# Patient Record
Sex: Female | Born: 1965 | Race: Black or African American | Hispanic: No | Marital: Single | Smoking: Heavy tobacco smoker
Health system: Southern US, Community
[De-identification: ages and names within clinical notes are randomized; demographics above are authoritative.]

## PROBLEM LIST (undated history)

## (undated) DIAGNOSIS — M1990 Unspecified osteoarthritis, unspecified site: Secondary | ICD-10-CM

---

## 1998-07-14 ENCOUNTER — Emergency Department (HOSPITAL_COMMUNITY): Admission: EM | Admit: 1998-07-14 | Discharge: 1998-07-14 | Payer: Self-pay | Admitting: *Deleted

## 1998-08-12 ENCOUNTER — Other Ambulatory Visit: Admission: RE | Admit: 1998-08-12 | Discharge: 1998-08-12 | Payer: Self-pay | Admitting: Family Medicine

## 2000-02-19 ENCOUNTER — Other Ambulatory Visit: Admission: RE | Admit: 2000-02-19 | Discharge: 2000-02-19 | Payer: Self-pay | Admitting: Internal Medicine

## 2002-01-26 ENCOUNTER — Other Ambulatory Visit: Admission: RE | Admit: 2002-01-26 | Discharge: 2002-01-26 | Payer: Self-pay | Admitting: Internal Medicine

## 2003-02-15 ENCOUNTER — Other Ambulatory Visit: Admission: RE | Admit: 2003-02-15 | Discharge: 2003-02-15 | Payer: Self-pay | Admitting: *Deleted

## 2004-07-28 ENCOUNTER — Other Ambulatory Visit: Admission: RE | Admit: 2004-07-28 | Discharge: 2004-07-28 | Payer: Self-pay | Admitting: Obstetrics and Gynecology

## 2004-08-03 ENCOUNTER — Ambulatory Visit (HOSPITAL_COMMUNITY): Admission: RE | Admit: 2004-08-03 | Discharge: 2004-08-03 | Payer: Self-pay | Admitting: Obstetrics and Gynecology

## 2005-10-07 ENCOUNTER — Encounter: Admission: RE | Admit: 2005-10-07 | Discharge: 2005-10-07 | Payer: Self-pay | Admitting: Obstetrics and Gynecology

## 2006-06-16 ENCOUNTER — Encounter: Admission: RE | Admit: 2006-06-16 | Discharge: 2006-06-16 | Payer: Self-pay | Admitting: Obstetrics and Gynecology

## 2006-08-15 ENCOUNTER — Ambulatory Visit (HOSPITAL_COMMUNITY): Admission: RE | Admit: 2006-08-15 | Discharge: 2006-08-15 | Payer: Self-pay | Admitting: Obstetrics and Gynecology

## 2008-07-18 ENCOUNTER — Ambulatory Visit (HOSPITAL_COMMUNITY): Admission: RE | Admit: 2008-07-18 | Discharge: 2008-07-18 | Payer: Self-pay | Admitting: Obstetrics and Gynecology

## 2008-07-18 ENCOUNTER — Encounter (INDEPENDENT_AMBULATORY_CARE_PROVIDER_SITE_OTHER): Payer: Self-pay | Admitting: Obstetrics and Gynecology

## 2009-08-16 ENCOUNTER — Ambulatory Visit (HOSPITAL_COMMUNITY): Admission: RE | Admit: 2009-08-16 | Discharge: 2009-08-16 | Payer: Self-pay | Admitting: Family Medicine

## 2009-10-10 ENCOUNTER — Encounter
Admission: RE | Admit: 2009-10-10 | Discharge: 2009-10-17 | Payer: Self-pay | Source: Home / Self Care | Attending: Physical Medicine and Rehabilitation | Admitting: Physical Medicine and Rehabilitation

## 2009-10-17 ENCOUNTER — Ambulatory Visit: Payer: Self-pay | Admitting: Physical Medicine and Rehabilitation

## 2009-10-18 ENCOUNTER — Ambulatory Visit (HOSPITAL_COMMUNITY)
Admission: RE | Admit: 2009-10-18 | Discharge: 2009-10-18 | Payer: Self-pay | Admitting: Physical Medicine and Rehabilitation

## 2009-10-25 ENCOUNTER — Ambulatory Visit (HOSPITAL_COMMUNITY)
Admission: RE | Admit: 2009-10-25 | Discharge: 2009-10-25 | Payer: Self-pay | Admitting: Physical Medicine and Rehabilitation

## 2009-11-01 ENCOUNTER — Ambulatory Visit (HOSPITAL_COMMUNITY)
Admission: RE | Admit: 2009-11-01 | Discharge: 2009-11-01 | Payer: Self-pay | Admitting: Physical Medicine and Rehabilitation

## 2010-01-24 ENCOUNTER — Encounter: Payer: Self-pay | Admitting: Obstetrics and Gynecology

## 2010-01-25 ENCOUNTER — Encounter: Payer: Self-pay | Admitting: Obstetrics and Gynecology

## 2010-03-18 LAB — CREATININE, SERUM
Creatinine, Ser: 0.58 mg/dL (ref 0.4–1.2)
GFR calc Af Amer: >60 mL/min (ref >60)
GFR calc non Af Amer: >60 mL/min (ref >60)

## 2010-03-18 LAB — BUN: BUN: 7 mg/dL (ref 6–23)

## 2010-04-12 LAB — DIFFERENTIAL
Basophils Relative: 1 % (ref 0–1)
Eosinophils Absolute: 0 10*3/uL (ref 0.0–0.7)
Lymphs Abs: 1.8 10*3/uL (ref 0.7–4.0)
Neutro Abs: 2.5 10*3/uL (ref 1.7–7.7)
Neutrophils Relative %: 52 % (ref 43–77)

## 2010-04-12 LAB — CBC
MCV: 96.4 fL (ref 78.0–100.0)
Platelets: 232 10*3/uL (ref 150–400)
WBC: 4.7 10*3/uL (ref 4.0–10.5)

## 2010-04-12 LAB — HCG, SERUM, QUALITATIVE: Preg, Serum: NEGATIVE

## 2010-05-19 NOTE — Op Note (Signed)
Erin Dalton, Erin Dalton                ACCOUNT NO.:  [id number]   MEDICAL RECORD NO.:  [id number]          PATIENT TYPE:  AMB   LOCATION:  SDC                           FACILITY:  [facility]   PHYSICIAN:  Juluis Mire, M.D.   DATE OF BIRTH:  1965/03/06   DATE OF PROCEDURE:  07/18/2008  DATE OF DISCHARGE:  07/18/2008                               OPERATIVE REPORT   PREOPERATIVE DIAGNOSIS:  Endometrial polyp.   POSTOPERATIVE DIAGNOSIS:  Endometrial polyp.   OPERATIVE PROCEDURE:  Paracervical block.  Cervical dilatation with  hysteroscopy.  Resection of polyp.  Endometrial biopsies and curettings.   SURGEON:  Juluis Mire, MD   ANESTHESIA:  MAC along with paracervical block.   ESTIMATED BLOOD LOSS:  Minimal.   PACKS AND DRAINS:  None.   INTRAOPERATIVE BLOOD PLACED:  None.   COMPLICATIONS:  None.   INDICATIONS:  Dictated history and physical.   PROCEDURE:  The patient was taken to the OR, placed in supine position.  After satisfactory level of sedation, the patient was placed in dorsal  lithotomy position using the Allen stirrups.  The patient was draped as  a sterile field.  A speculum was placed in the vaginal vault.  The  cervix and vagina were cleansed out with Betadine.  Cervix grasped with  a single-tooth tenaculum.  A paracervical block was instituted using 1%  Nesacaine.  Uterus sounded to 9 cm.  Cervix serially dilated to a size  31 Pratt dilator.  Operative hysteroscope was introduced.  Visualization  revealed a posterior wall polyp.  This was resected and sent for  Pathology.  Endometrium was otherwise clear.  Multiple biopsies were  obtained along with curettings.  Total deficit was 65 mL.  There was  minimal bleeding.  No signs of perforation.  The speculum and single-  tooth tenaculum were removed.  The patient taken out of the dorsal  lithotomy position.  Once alert, transferred to recovery room in good  condition.  Sponge, instrument, needle count reported as correct  by the  circulating nurse.      Juluis Mire, M.D.  Electronically Signed    JSM/MEDQ  D:  07/18/2008  T:  07/19/2008  Job:  [id number]

## 2010-05-19 NOTE — H&P (Signed)
Erin Dalton, FAROOQ                ACCOUNT NO.:  [id number]   MEDICAL RECORD NO.:  [id number]          PATIENT TYPE:  AMB   LOCATION:  SDC                           FACILITY:  [facility]   PHYSICIAN:  Juluis Mire, M.D.   DATE OF BIRTH:  07/29/1965   DATE OF ADMISSION:  07/18/2008  DATE OF DISCHARGE:                              HISTORY & PHYSICAL   The patient is a 45 year old gravida 1, para 1 female presents for  hysteroscopy.   Relation to present admission, the patient has had abnormal bleeding.  She has had 2 saline infusion ultrasounds that revealed large  endometrial polyp.  She now presents for hysteroscopic resection.   In terms of allergies, she is allergic to ASPIRIN.   MEDICATIONS:  No medications.   PAST MEDICAL HISTORY:  Usual childhood diseases.  No significant  sequelae.  Previous cesarean section.   FAMILY HISTORY:  Noncontributory.   SOCIAL HISTORY:  Reveals tobacco and alcohol use.   REVIEW OF SYSTEMS:  Noncontributory.   PHYSICAL EXAMINATION:  GENERAL:  The patient is afebrile, stable vital  signs.  HEENT:  The patient is normocephalic.  Pupils equal, round, and reactive  to light and accommodation.  Extraocular movements were intact.  Sclerae  and conjunctivae are clear.  Oropharynx is clear.  BREASTS:  No discrete masses.  LUNGS:  Clear.  CARDIAC:  Regular rate.  No murmurs or gallops.  ABDOMEN:  Benign.  No mass, organomegaly, or tenderness.  PELVIC:  Normal external genitalia.  Vaginal mucosa is clear.  Cervix is  unremarkable.  Uterus normal size, shape, and contour.  Adnexa, free of  masses or tenderness.  EXTREMITIES:  Trace edema.  Neurologic:  Grossly within normal limits.   IMPRESSION:  Endometrial polyp.   PLAN:  The patient will undergo hysteroscopic resection.  The nature of  the procedure have been discussed.  The risks explained including the  risk of infection.  The risk of vascular injury could lead to  hemorrhage, requiring  transfusion with the risk of AIDS or hepatitis.  Excessive bleeding  could require hysterectomy.  There is a risk of perforation leading to  injury to adjacent organs requiring further exploratory surgery.  Risk  of deep venous thrombosis and pulmonary embolus.  The patient expressed  an understanding indications and risks.      Juluis Mire, M.D.  Electronically Signed     JSM/MEDQ  D:  07/18/2008  T:  07/18/2008  Job:  [id number]

## 2012-09-17 ENCOUNTER — Encounter (HOSPITAL_COMMUNITY): Payer: Self-pay | Admitting: *Deleted

## 2012-09-17 ENCOUNTER — Emergency Department (HOSPITAL_COMMUNITY)
Admission: EM | Admit: 2012-09-17 | Discharge: 2012-09-17 | Disposition: A | Discharge status: Home / Self Care | Payer: Commercial Managed Care - PPO | Source: Home / Self Care | Attending: Family Medicine | Admitting: Family Medicine

## 2012-09-17 DIAGNOSIS — T783XXA Angioneurotic edema, initial encounter: Secondary | ICD-10-CM

## 2012-09-17 DIAGNOSIS — T7840XA Allergy, unspecified, initial encounter: Secondary | ICD-10-CM

## 2012-09-17 HISTORY — DX: Unspecified osteoarthritis, unspecified site: M19.90

## 2012-09-17 MED ORDER — DEXAMETHASONE SODIUM PHOSPHATE 10 MG/ML IJ SOLN
INTRAMUSCULAR | Status: AC
  Filled 2012-09-17: qty 1

## 2012-09-17 MED ORDER — RANITIDINE HCL 150 MG PO CAPS: 150.0000 mg | ORAL_CAPSULE | Freq: Every day | ORAL | Status: DC

## 2012-09-17 MED ORDER — CETIRIZINE HCL 10 MG PO TABS: 10.0000 mg | ORAL_TABLET | Freq: Every day | ORAL | Status: DC

## 2012-09-17 MED ORDER — DEXAMETHASONE SODIUM PHOSPHATE 10 MG/ML IJ SOLN
10.0000 mg | Freq: Once | INTRAMUSCULAR | Status: AC
  Administered 2012-09-17: 10 mg via INTRAMUSCULAR

## 2012-09-17 MED ORDER — PREDNISONE 10 MG PO KIT: PACK | ORAL | Status: DC

## 2012-09-17 NOTE — ED Provider Notes (Signed)
CSN: [id number]     Arrival date & time 09/17/12  1211 History   First MD Initiated Contact with Patient 09/17/12 1310     Chief Complaint  Patient presents with  . Facial Swelling   (Consider location/radiation/quality/duration/timing/severity/associated sxs/prior Treatment) HPI Comments: 47 year old female presents complaining of allergic reaction to hair dye used yesterday. She has had this exact thing happen before and she had instructed her hair, she didn't have asked that we used the same one. She has swelling around her eyes, not painful, and is without any other systemic symptoms including no fever, chills, pain in the eyes, shortness of breath, throat or tongue swelling, diarrhea. She has been taking Benadryl at home has not been helping. This is exactly the same as her previous reaction   Past Medical History  Diagnosis Date  . Arthritis    History reviewed. No pertinent past surgical history. No family history on file. History  Substance Use Topics  . Smoking status: Heavy Tobacco Smoker -- 0.50 packs/day    Types: Cigarettes  . Smokeless tobacco: Not on file  . Alcohol Use: Yes     Comment: accasionally   OB History   Grav Para Term Preterm Abortions TAB SAB Ect Mult Living                 Review of Systems  Constitutional: Negative for fever and chills.  HENT: Positive for facial swelling.   Eyes: Negative for visual disturbance.  Respiratory: Negative for cough and shortness of breath.   Cardiovascular: Negative for chest pain, palpitations and leg swelling.  Gastrointestinal: Negative for nausea, vomiting and abdominal pain.  Endocrine: Negative for polydipsia and polyuria.  Genitourinary: Negative for dysuria, urgency and frequency.  Musculoskeletal: Negative for myalgias and arthralgias.  Skin: Negative for rash.  Neurological: Negative for dizziness, weakness and light-headedness.    Allergies  Review of patient's allergies indicates no known  allergies.  Home Medications   Current Outpatient Rx  Name  Route  Sig  Dispense  Refill  . diphenhydrAMINE (SOMINEX) 25 MG tablet   Oral   Take 25 mg by mouth at bedtime as needed for sleep.         . cetirizine (ZYRTEC) 10 MG tablet   Oral   Take 1 tablet (10 mg total) by mouth daily.   30 tablet   0   . PredniSONE 10 MG KIT      12 day taper dose pack. Use as directed   48 each   0   . ranitidine (ZANTAC) 150 MG capsule   Oral   Take 1 capsule (150 mg total) by mouth daily.   30 capsule   0    BP 121/63  Pulse 85  Temp(Src) 98.8 F (37.1 C) (Oral)  Resp 18  SpO2 100%  LMP 08/20/2012 Physical Exam  Nursing note and vitals reviewed. Constitutional: She is oriented to person, place, and time. Vital signs are normal. She appears well-developed and well-nourished. No distress.  HENT:  Head: Normocephalic and atraumatic.    Pulmonary/Chest: Effort normal and breath sounds normal. No respiratory distress. She has no wheezes. She has no rales.  Neurological: She is alert and oriented to person, place, and time. She has normal strength. Coordination normal.  Skin: Skin is warm and dry. No rash noted. She is not diaphoretic.  Psychiatric: She has a normal mood and affect. Judgment normal.    ED Course  Procedures (including critical care time) Labs Review Labs Reviewed -  No data to display Imaging Review No results found.  MDM   1. Allergic reaction, initial encounter   2. Angioedema, initial encounter    No shortness of breath or signs of severe allergic reaction. Treating with steroids, H1 and H2 blockers. Followup if not improving.   Meds ordered this encounter  Medications  . dexamethasone (DECADRON) injection 10 mg    Sig:   . PredniSONE 10 MG KIT    Sig: 12 day taper dose pack. Use as directed    Dispense:  48 each    Refill:  0  . cetirizine (ZYRTEC) 10 MG tablet    Sig: Take 1 tablet (10 mg total) by mouth daily.    Dispense:  30 tablet     Refill:  0  . ranitidine (ZANTAC) 150 MG capsule    Sig: Take 1 capsule (150 mg total) by mouth daily.    Dispense:  30 capsule    Refill:  0       Graylon Good, PA-C 09/17/12 1430

## 2012-09-17 NOTE — ED Notes (Addendum)
Patient states she stated getting an allergic reaction Friday and took benadryl, but stated her face started swelling; states swelling got worse this morning; denies tongue and throat swelling, and shortness of breath.  States she had an allergic reaction to hair dye on Friday.

## 2012-09-18 NOTE — ED Provider Notes (Signed)
Medical screening examination/treatment/procedure(s) were performed by a resident physician or non-physician practitioner and as the supervising physician I was immediately available for consultation/collaboration.  Rea Kalama, MD    Seville Brick S Shelba Susi, MD 09/18/12 0800 

## 2013-11-02 ENCOUNTER — Telehealth: Payer: Self-pay | Admitting: *Deleted

## 2013-11-05 NOTE — Telephone Encounter (Signed)
Wrong patient

## 2013-11-05 NOTE — Telephone Encounter (Signed)
Called patient left message that her insurance will not cover the voltaren gel

## 2015-01-16 ENCOUNTER — Encounter: Payer: Self-pay | Admitting: Obstetrics and Gynecology

## 2015-02-19 ENCOUNTER — Emergency Department (HOSPITAL_COMMUNITY)
Admission: EM | Admit: 2015-02-19 | Discharge: 2015-02-19 | Disposition: A | Discharge status: Home / Self Care | Payer: Commercial Managed Care - PPO | Source: Home / Self Care | Attending: Family Medicine | Admitting: Family Medicine

## 2015-02-19 ENCOUNTER — Encounter (HOSPITAL_COMMUNITY): Payer: Self-pay | Admitting: Emergency Medicine

## 2015-02-19 DIAGNOSIS — J069 Acute upper respiratory infection, unspecified: Secondary | ICD-10-CM

## 2015-02-19 DIAGNOSIS — B9789 Other viral agents as the cause of diseases classified elsewhere: Principal | ICD-10-CM

## 2015-02-19 NOTE — ED Notes (Signed)
Pt has been suffering from head and chest congestion along with body aches since yesterday.  She denies fever.  She is also reporting pain in her left wrist that started last night.

## 2015-02-19 NOTE — ED Provider Notes (Signed)
CSN: [id number]     Arrival date & time 02/19/15  1400 History   None    No chief complaint on file.  (Consider location/radiation/quality/duration/timing/severity/associated sxs/prior Treatment) HPI Erin Dalton is a 50 y.o. female smoker presenting for 2 days of cough and chest congestion.   She reports gradual onset 2 days ago of general malaise, body aches, nonproductive cough, and occasional chills. She denies fever, reports some runny nose. Nephew was sick a couple days ago.  Never gets flu shot. Smokes daily, occasional EtOH, no illicit drugs.   Past Medical History  Diagnosis Date  . Arthritis    No past surgical history on file. No family history on file. Social History  Substance Use Topics  . Smoking status: Heavy Tobacco Smoker -- 0.50 packs/day    Types: Cigarettes  . Smokeless tobacco: Not on file  . Alcohol Use: Yes     Comment: accasionally   OB History    No data available     Review of Systems As above  Allergies  Review of patient's allergies indicates no known allergies.  Home Medications   Prior to Admission medications   Medication Sig Start Date End Date Taking? Authorizing Provider  cetirizine (ZYRTEC) 10 MG tablet Take 1 tablet (10 mg total) by mouth daily. 09/17/12   Liam Graham, PA-C  diphenhydrAMINE (SOMINEX) 25 MG tablet Take 25 mg by mouth at bedtime as needed for sleep.    Historical Provider, MD  PredniSONE 10 MG KIT 12 day taper dose pack. Use as directed 09/17/12   Liam Graham, PA-C  ranitidine (ZANTAC) 150 MG capsule Take 1 capsule (150 mg total) by mouth daily. 09/17/12   Liam Graham, PA-C   Meds Ordered and Administered this Visit  Medications - No data to display  BP 121/84 mmHg  Pulse 101  Temp(Src) 98.3 F (36.8 C) (Oral)  Resp 14  SpO2 96% No data found.  Physical Exam  Constitutional: She is oriented to person, place, and time. She appears well-developed and well-nourished. No distress.  Eyes: EOM are normal.  Pupils are equal, round, and reactive to light. No scleral icterus.  Neck: Neck supple. No JVD present.  Cardiovascular: Normal rate, regular rhythm, normal heart sounds and intact distal pulses.   No murmur heard. Pulmonary/Chest: Effort normal and breath sounds normal. No respiratory distress.  Abdominal: Soft. Bowel sounds are normal. She exhibits no distension. There is no tenderness.  Musculoskeletal: Normal range of motion. She exhibits no edema or tenderness.  Lymphadenopathy:    She has no cervical adenopathy.  Neurological: She is alert and oriented to person, place, and time. She exhibits normal muscle tone.  Skin: Skin is warm and dry.  Vitals reviewed.  ED Course  Procedures (including critical care time)  Labs Review Labs Reviewed - No data to display  Imaging Review No results found.  MDM  No diagnosis found.  50 y.o. female with viral URI with cough. No red flags. Reviewed supportive measures, expected duration, avoidance of medications, and return precautions. - Smoking cessation counseling - Advised to get vaccinations and establish with PCP  This patient's case was discussed with the attending provider who examined the patient and helped formulate the plan of care.   Bladimir Auman B. Bonner Puna, MD, PGY-3 02/19/2015 3:43 PM   Patrecia Pour, MD 02/19/15 [phone]

## 2015-02-19 NOTE — Discharge Instructions (Signed)
You have a viral infection.  The good news is it goes away on its own and the bad news is we don't have much of a treatment to make it go away faster. Antibiotics are useless against viruses.  What to do:  - Drink adequate fluids.  - Use nasal saline spray as needed to help clear runny nose. - Take warm showers and drink hot tea. The humid air will help with runny nose and congestion. - Take 1 tbsp of honey every 2 hours to help with cough and sore throat. - You can use tylenol alternating with motrin every 3 hours for fever with pain.  - All members in the household should wash their hands frequently. Cold-causing viruses can stay on hands for 2 days or more.  - Minimize your exposure to smoke from cigarettes, etc.  Timeline:  - Colds usually persist for 3 to 10 days in the most people but it can take 2-3 weeks for cough to completely go away.  - If you start getting worse after initial improvement or if this is not going away in the next 10 days you should make another appointment. If you have difficulty breathing you should present to the emergency room right away.   

## 2018-04-24 ENCOUNTER — Other Ambulatory Visit: Payer: Self-pay | Admitting: Obstetrics and Gynecology

## 2018-04-24 DIAGNOSIS — R928 Other abnormal and inconclusive findings on diagnostic imaging of breast: Secondary | ICD-10-CM

## 2018-05-02 ENCOUNTER — Ambulatory Visit
Admission: RE | Admit: 2018-05-02 | Discharge: 2018-05-02 | Disposition: A | Discharge status: Home / Self Care | Payer: Commercial Managed Care - PPO | Source: Ambulatory Visit | Attending: Obstetrics and Gynecology | Admitting: Obstetrics and Gynecology

## 2018-05-02 ENCOUNTER — Other Ambulatory Visit: Payer: Self-pay

## 2018-05-02 DIAGNOSIS — R928 Other abnormal and inconclusive findings on diagnostic imaging of breast: Secondary | ICD-10-CM

## 2020-01-08 ENCOUNTER — Other Ambulatory Visit: Payer: Self-pay | Admitting: Obstetrics and Gynecology

## 2020-01-08 DIAGNOSIS — R928 Other abnormal and inconclusive findings on diagnostic imaging of breast: Secondary | ICD-10-CM

## 2020-02-14 ENCOUNTER — Ambulatory Visit: Payer: Commercial Managed Care - PPO

## 2020-02-14 ENCOUNTER — Ambulatory Visit
Admission: RE | Admit: 2020-02-14 | Discharge: 2020-02-14 | Disposition: A | Discharge status: Home / Self Care | Payer: Commercial Managed Care - PPO | Source: Ambulatory Visit | Attending: Obstetrics and Gynecology | Admitting: Obstetrics and Gynecology

## 2020-02-14 ENCOUNTER — Other Ambulatory Visit: Payer: Self-pay

## 2020-02-14 DIAGNOSIS — R928 Other abnormal and inconclusive findings on diagnostic imaging of breast: Secondary | ICD-10-CM

## 2021-03-05 DIAGNOSIS — Z01419 Encounter for gynecological examination (general) (routine) without abnormal findings: Secondary | ICD-10-CM | POA: Diagnosis not present

## 2021-03-05 DIAGNOSIS — Z6820 Body mass index (BMI) 20.0-20.9, adult: Secondary | ICD-10-CM | POA: Diagnosis not present

## 2021-03-05 DIAGNOSIS — Z1231 Encounter for screening mammogram for malignant neoplasm of breast: Secondary | ICD-10-CM | POA: Diagnosis not present

## 2021-03-05 DIAGNOSIS — Z13820 Encounter for screening for osteoporosis: Secondary | ICD-10-CM | POA: Diagnosis not present

## 2021-04-01 DIAGNOSIS — N72 Inflammatory disease of cervix uteri: Secondary | ICD-10-CM | POA: Diagnosis not present

## 2021-04-01 DIAGNOSIS — R87810 Cervical high risk human papillomavirus (HPV) DNA test positive: Secondary | ICD-10-CM | POA: Diagnosis not present

## 2022-04-01 DIAGNOSIS — M79645 Pain in left finger(s): Secondary | ICD-10-CM | POA: Diagnosis not present

## 2022-07-23 IMAGING — MG MM DIGITAL DIAGNOSTIC UNILAT*L* W/ TOMO W/ CAD
4 series · 4 of 12 positions shown · non-contrast
Comparison: Previous exams.

CLINICAL DATA: Screening recall for asymmetry seen in the left
breast on the cc view only.

EXAM:
DIGITAL DIAGNOSTIC UNILATERAL LEFT MAMMOGRAM WITH TOMOSYNTHESIS AND
CAD
TECHNIQUE: Left digital diagnostic mammography and breast tomosynthesis was
performed. The images were evaluated with computer-aided detection.

[L CC synth-2D]
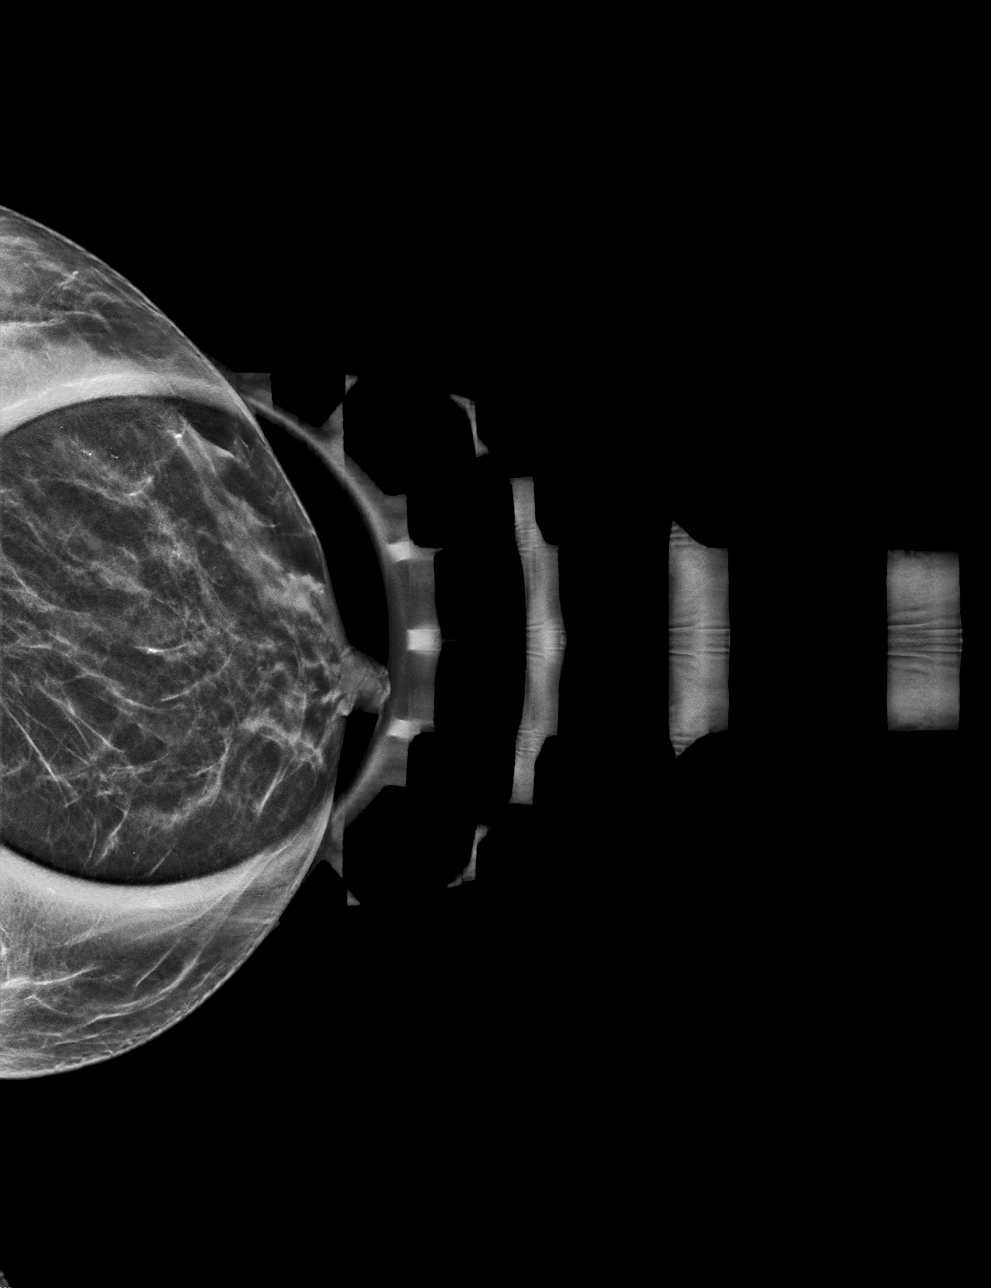

[L ML synth-2D]
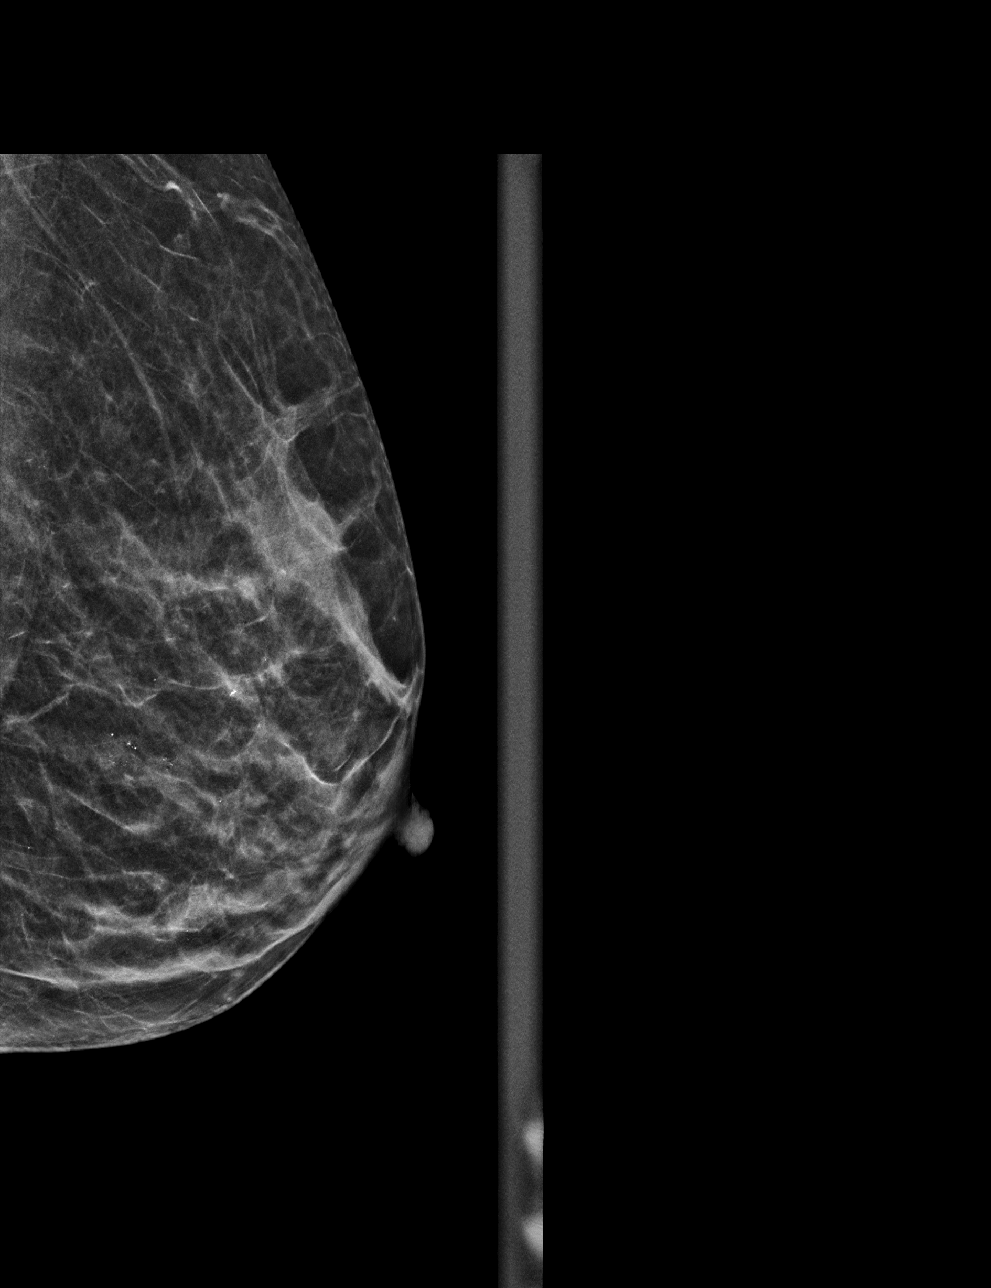

[L ML tomo · tomo slice 21/41.0]
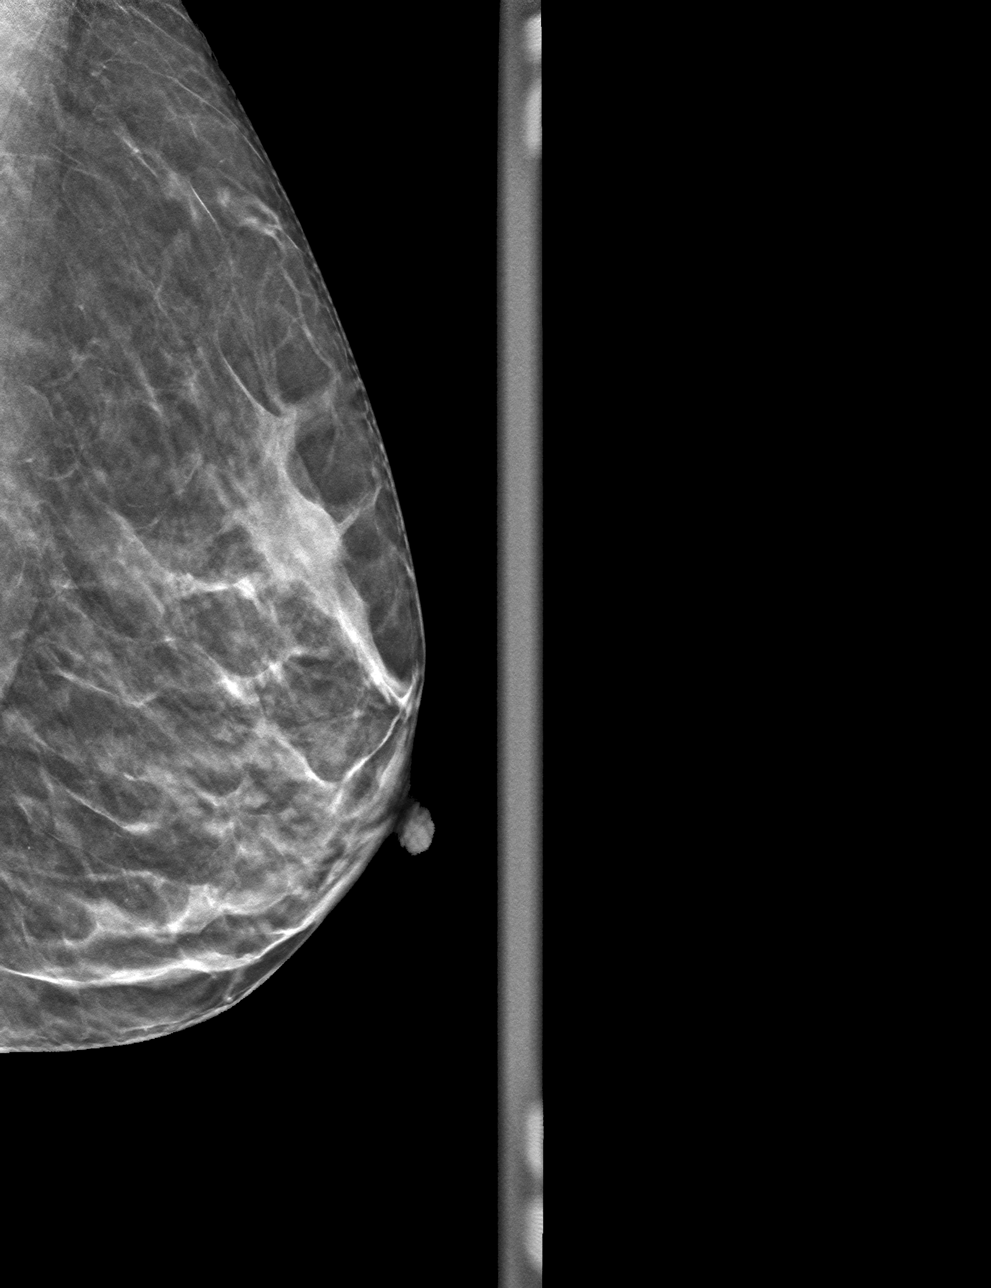

[L CC tomo · tomo slice 21/40.0]
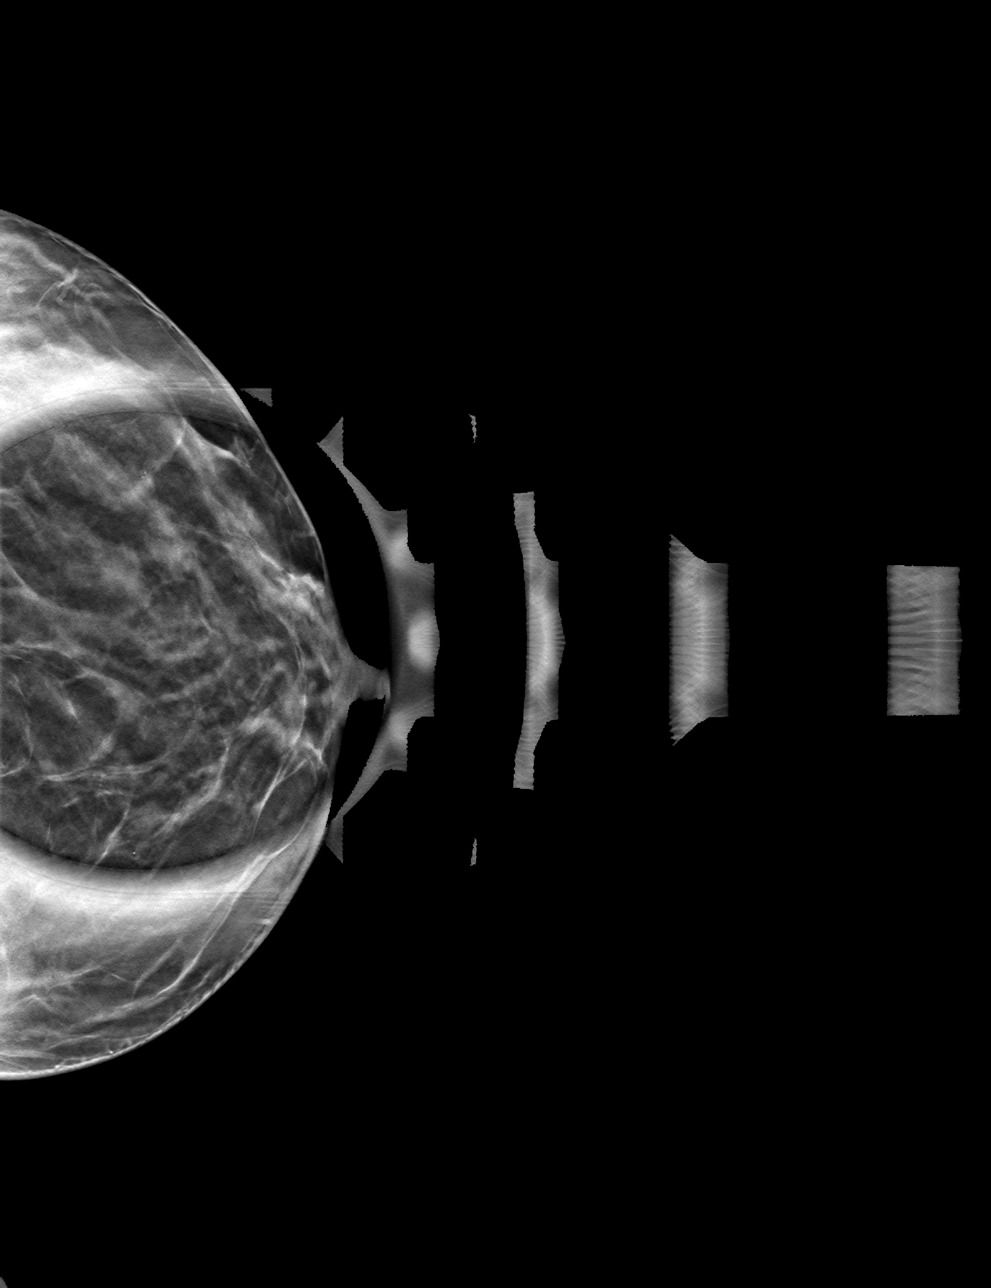

[4 of 12 positions shown; findings below may reference images not displayed]

ACR Breast Density Category c: The breast tissue is heterogeneously
dense, which may obscure small masses.
FINDINGS: Additional tomograms were performed of the left breast. The
initially questioned possible left breast asymmetry resolves on the
additional imaging with findings compatible with an area of
overlapping fibroglandular tissue. There is no mammographic evidence
of malignancy in the left breast.
IMPRESSION: No mammographic evidence of malignancy in the left breast.

RECOMMENDATION:
Screening mammogram in one year.(Code:V9-I-VXX)

I have discussed the findings and recommendations with the patient.
If applicable, a reminder letter will be sent to the patient
regarding the next appointment.

BI-RADS CATEGORY  1: Negative.

## 2022-10-29 DIAGNOSIS — Z01419 Encounter for gynecological examination (general) (routine) without abnormal findings: Secondary | ICD-10-CM | POA: Diagnosis not present

## 2022-10-29 DIAGNOSIS — Z1231 Encounter for screening mammogram for malignant neoplasm of breast: Secondary | ICD-10-CM | POA: Diagnosis not present

## 2022-10-29 DIAGNOSIS — Z124 Encounter for screening for malignant neoplasm of cervix: Secondary | ICD-10-CM | POA: Diagnosis not present

## 2022-10-29 DIAGNOSIS — Z6822 Body mass index (BMI) 22.0-22.9, adult: Secondary | ICD-10-CM | POA: Diagnosis not present

## 2022-11-23 DIAGNOSIS — Z0000 Encounter for general adult medical examination without abnormal findings: Secondary | ICD-10-CM | POA: Diagnosis not present

## 2022-11-23 DIAGNOSIS — R7303 Prediabetes: Secondary | ICD-10-CM | POA: Diagnosis not present

## 2022-11-23 DIAGNOSIS — E7800 Pure hypercholesterolemia, unspecified: Secondary | ICD-10-CM | POA: Diagnosis not present

## 2022-11-23 DIAGNOSIS — E559 Vitamin D deficiency, unspecified: Secondary | ICD-10-CM | POA: Diagnosis not present

## 2022-11-30 ENCOUNTER — Other Ambulatory Visit (HOSPITAL_BASED_OUTPATIENT_CLINIC_OR_DEPARTMENT_OTHER): Payer: Self-pay

## 2022-11-30 DIAGNOSIS — Z23 Encounter for immunization: Secondary | ICD-10-CM | POA: Diagnosis not present

## 2022-11-30 DIAGNOSIS — Z0000 Encounter for general adult medical examination without abnormal findings: Secondary | ICD-10-CM | POA: Diagnosis not present

## 2022-11-30 DIAGNOSIS — F418 Other specified anxiety disorders: Secondary | ICD-10-CM | POA: Diagnosis not present

## 2022-11-30 DIAGNOSIS — E7800 Pure hypercholesterolemia, unspecified: Secondary | ICD-10-CM

## 2022-11-30 DIAGNOSIS — E559 Vitamin D deficiency, unspecified: Secondary | ICD-10-CM | POA: Diagnosis not present

## 2022-12-10 ENCOUNTER — Ambulatory Visit (HOSPITAL_COMMUNITY)
Admission: RE | Admit: 2022-12-10 | Discharge: 2022-12-10 | Disposition: A | Discharge status: Home / Self Care | Payer: Commercial Managed Care - PPO | Source: Ambulatory Visit

## 2022-12-10 DIAGNOSIS — E7800 Pure hypercholesterolemia, unspecified: Secondary | ICD-10-CM | POA: Insufficient documentation

## 2023-05-27 ENCOUNTER — Other Ambulatory Visit: Payer: Self-pay

## 2023-05-27 DIAGNOSIS — R911 Solitary pulmonary nodule: Secondary | ICD-10-CM

## 2023-10-31 ENCOUNTER — Other Ambulatory Visit: Payer: Self-pay | Admitting: Obstetrics and Gynecology

## 2023-10-31 DIAGNOSIS — Z13228 Encounter for screening for other metabolic disorders: Secondary | ICD-10-CM | POA: Diagnosis not present

## 2023-10-31 DIAGNOSIS — Z6821 Body mass index (BMI) 21.0-21.9, adult: Secondary | ICD-10-CM | POA: Diagnosis not present

## 2023-10-31 DIAGNOSIS — Z720 Tobacco use: Secondary | ICD-10-CM

## 2023-10-31 DIAGNOSIS — Z13220 Encounter for screening for lipoid disorders: Secondary | ICD-10-CM | POA: Diagnosis not present

## 2023-10-31 DIAGNOSIS — Z01419 Encounter for gynecological examination (general) (routine) without abnormal findings: Secondary | ICD-10-CM | POA: Diagnosis not present

## 2023-10-31 DIAGNOSIS — Z1151 Encounter for screening for human papillomavirus (HPV): Secondary | ICD-10-CM | POA: Diagnosis not present

## 2023-10-31 DIAGNOSIS — Z131 Encounter for screening for diabetes mellitus: Secondary | ICD-10-CM | POA: Diagnosis not present

## 2023-10-31 DIAGNOSIS — Z124 Encounter for screening for malignant neoplasm of cervix: Secondary | ICD-10-CM | POA: Diagnosis not present

## 2023-10-31 DIAGNOSIS — Z1321 Encounter for screening for nutritional disorder: Secondary | ICD-10-CM | POA: Diagnosis not present

## 2023-11-02 ENCOUNTER — Other Ambulatory Visit: Payer: Self-pay | Admitting: Obstetrics and Gynecology

## 2023-11-02 DIAGNOSIS — Z720 Tobacco use: Secondary | ICD-10-CM

## 2023-11-29 DIAGNOSIS — E559 Vitamin D deficiency, unspecified: Secondary | ICD-10-CM | POA: Diagnosis not present

## 2023-11-29 DIAGNOSIS — Z0000 Encounter for general adult medical examination without abnormal findings: Secondary | ICD-10-CM | POA: Diagnosis not present

## 2023-11-29 DIAGNOSIS — R7303 Prediabetes: Secondary | ICD-10-CM | POA: Diagnosis not present

## 2023-12-06 ENCOUNTER — Other Ambulatory Visit: Payer: Self-pay

## 2023-12-06 DIAGNOSIS — R911 Solitary pulmonary nodule: Secondary | ICD-10-CM

## 2023-12-06 DIAGNOSIS — Z0000 Encounter for general adult medical examination without abnormal findings: Secondary | ICD-10-CM | POA: Diagnosis not present

## 2023-12-06 DIAGNOSIS — R7303 Prediabetes: Secondary | ICD-10-CM | POA: Diagnosis not present

## 2023-12-06 DIAGNOSIS — E782 Mixed hyperlipidemia: Secondary | ICD-10-CM | POA: Diagnosis not present
